# Patient Record
Sex: Male | Born: 1960 | Race: White | Hispanic: No | Smoking: Current every day smoker
Health system: Southern US, Community
[De-identification: ages and names within clinical notes are randomized; demographics above are authoritative.]

## PROBLEM LIST (undated history)

## (undated) DIAGNOSIS — I10 Essential (primary) hypertension: Secondary | ICD-10-CM

## (undated) HISTORY — PX: APPENDECTOMY: SHX54

---

## 2012-07-19 ENCOUNTER — Encounter (INDEPENDENT_AMBULATORY_CARE_PROVIDER_SITE_OTHER): Payer: Self-pay | Admitting: *Deleted

## 2014-12-29 ENCOUNTER — Encounter (INDEPENDENT_AMBULATORY_CARE_PROVIDER_SITE_OTHER): Payer: Self-pay | Admitting: *Deleted

## 2016-07-19 ENCOUNTER — Encounter (INDEPENDENT_AMBULATORY_CARE_PROVIDER_SITE_OTHER): Payer: Self-pay | Admitting: *Deleted

## 2016-08-05 ENCOUNTER — Other Ambulatory Visit (INDEPENDENT_AMBULATORY_CARE_PROVIDER_SITE_OTHER): Payer: Self-pay | Admitting: *Deleted

## 2016-08-05 ENCOUNTER — Encounter (INDEPENDENT_AMBULATORY_CARE_PROVIDER_SITE_OTHER): Payer: Self-pay | Admitting: *Deleted

## 2016-08-05 DIAGNOSIS — Z1211 Encounter for screening for malignant neoplasm of colon: Secondary | ICD-10-CM

## 2016-08-05 DIAGNOSIS — Z800 Family history of malignant neoplasm of digestive organs: Secondary | ICD-10-CM | POA: Insufficient documentation

## 2016-09-29 ENCOUNTER — Telehealth (INDEPENDENT_AMBULATORY_CARE_PROVIDER_SITE_OTHER): Payer: Self-pay | Admitting: *Deleted

## 2016-09-29 ENCOUNTER — Encounter (INDEPENDENT_AMBULATORY_CARE_PROVIDER_SITE_OTHER): Payer: Self-pay | Admitting: *Deleted

## 2016-09-29 MED ORDER — PEG 3350-KCL-NA BICARB-NACL 420 G PO SOLR: 4000.0000 mL | Freq: Once | ORAL | 0 refills | Status: AC

## 2016-09-29 NOTE — Telephone Encounter (Signed)
Patient needs trilyte 

## 2016-09-29 NOTE — Telephone Encounter (Signed)
Referring MD/PCP: fagan   Procedure: tcs  Reason/Indication:  Screening, fam hx colon ca  Has patient had this procedure before?  no  If so, when, by whom and where?    Is there a family history of colon cancer?  Yes, father  Who?  What age when diagnosed?    Is patient diabetic?   no      Does patient have prosthetic heart valve or mechanical valve?  no  Do you have a pacemaker?  no  Has patient ever had endocarditis? no  Has patient had joint replacement within last 12 months?  no  Does patient tend to be constipated or take laxatives? no  Does patient have a history of alcohol/drug use?  no  Is patient on Coumadin, Plavix and/or Aspirin? yes  Medications: asa 81 mg daily, losartan 100 mg dailt  Allergies: nkda  Medication Adjustment per Dr Laural Golden: asa 2 days  Procedure date & time: 11/03/16 at 1200

## 2016-09-29 NOTE — Telephone Encounter (Signed)
agree

## 2016-11-03 ENCOUNTER — Ambulatory Visit (HOSPITAL_COMMUNITY)
Admission: RE | Admit: 2016-11-03 | Discharge: 2016-11-03 | Disposition: A | Discharge status: Home / Self Care | Payer: Managed Care, Other (non HMO) | Source: Ambulatory Visit | Attending: Internal Medicine | Admitting: Internal Medicine

## 2016-11-03 ENCOUNTER — Encounter (HOSPITAL_COMMUNITY)
Admission: RE | Disposition: A | Discharge status: Home / Self Care | Payer: Self-pay | Source: Ambulatory Visit | Attending: Internal Medicine

## 2016-11-03 ENCOUNTER — Encounter (HOSPITAL_COMMUNITY): Payer: Self-pay | Admitting: *Deleted

## 2016-11-03 DIAGNOSIS — F17210 Nicotine dependence, cigarettes, uncomplicated: Secondary | ICD-10-CM | POA: Diagnosis not present

## 2016-11-03 DIAGNOSIS — K5730 Diverticulosis of large intestine without perforation or abscess without bleeding: Secondary | ICD-10-CM | POA: Insufficient documentation

## 2016-11-03 DIAGNOSIS — K644 Residual hemorrhoidal skin tags: Secondary | ICD-10-CM | POA: Insufficient documentation

## 2016-11-03 DIAGNOSIS — Z7982 Long term (current) use of aspirin: Secondary | ICD-10-CM | POA: Diagnosis not present

## 2016-11-03 DIAGNOSIS — Z1211 Encounter for screening for malignant neoplasm of colon: Secondary | ICD-10-CM | POA: Diagnosis not present

## 2016-11-03 DIAGNOSIS — Z800 Family history of malignant neoplasm of digestive organs: Secondary | ICD-10-CM | POA: Insufficient documentation

## 2016-11-03 DIAGNOSIS — D125 Benign neoplasm of sigmoid colon: Secondary | ICD-10-CM

## 2016-11-03 DIAGNOSIS — K6289 Other specified diseases of anus and rectum: Secondary | ICD-10-CM

## 2016-11-03 DIAGNOSIS — D123 Benign neoplasm of transverse colon: Secondary | ICD-10-CM

## 2016-11-03 DIAGNOSIS — I10 Essential (primary) hypertension: Secondary | ICD-10-CM | POA: Diagnosis not present

## 2016-11-03 HISTORY — DX: Essential (primary) hypertension: I10

## 2016-11-03 HISTORY — PX: COLONOSCOPY: SHX5424

## 2016-11-03 SURGERY — COLONOSCOPY
Anesthesia: Moderate Sedation

## 2016-11-03 MED ORDER — MEPERIDINE HCL 50 MG/ML IJ SOLN
INTRAMUSCULAR | Status: AC
  Filled 2016-11-03: qty 1

## 2016-11-03 MED ORDER — MIDAZOLAM HCL 5 MG/5ML IJ SOLN
INTRAMUSCULAR | Status: AC
  Filled 2016-11-03: qty 10

## 2016-11-03 MED ORDER — MEPERIDINE HCL 50 MG/ML IJ SOLN
INTRAMUSCULAR | Status: DC | PRN
  Administered 2016-11-03 (×2): 25 mg

## 2016-11-03 MED ORDER — SODIUM CHLORIDE 0.9 % IV SOLN
INTRAVENOUS | Status: DC
  Administered 2016-11-03: 11:00:00 via INTRAVENOUS

## 2016-11-03 MED ORDER — MIDAZOLAM HCL 5 MG/5ML IJ SOLN
INTRAMUSCULAR | Status: DC | PRN
  Administered 2016-11-03 (×2): 2 mg via INTRAVENOUS
  Administered 2016-11-03: 1 mg via INTRAVENOUS
  Administered 2016-11-03: 2 mg via INTRAVENOUS

## 2016-11-03 NOTE — Discharge Instructions (Signed)
No Aspirin or NSAIDs for 1 week. Resume other medications and diet as before. No driving for 24 hours. Physician will call with biopsy results.   Colonoscopy, Adult, Care After This sheet gives you information about how to care for yourself after your procedure. Your health care provider may also give you more specific instructions. If you have problems or questions, contact your health care provider. What can I expect after the procedure? After the procedure, it is common to have:  A small amount of blood in your stool for 24 hours after the procedure.  Some gas.  Mild abdominal cramping or bloating.  Follow these instructions at home: General instructions   For the first 24 hours after the procedure: ? Do not drive or use machinery. ? Do not sign important documents. ? Do not drink alcohol. ? Do your regular daily activities at a slower pace than normal. ? Eat soft, easy-to-digest foods. ? Rest often.  Take over-the-counter or prescription medicines only as told by your health care provider.  It is up to you to get the results of your procedure. Ask your health care provider, or the department performing the procedure, when your results will be ready. Relieving cramping and bloating  Try walking around when you have cramps or feel bloated.  Apply heat to your abdomen as told by your health care provider. Use a heat source that your health care provider recommends, such as a moist heat pack or a heating pad. ? Place a towel between your skin and the heat source. ? Leave the heat on for 20-30 minutes. ? Remove the heat if your skin turns bright red. This is especially important if you are unable to feel pain, heat, or cold. You may have a greater risk of getting burned. Eating and drinking  Drink enough fluid to keep your urine clear or pale yellow.  Resume your normal diet as instructed by your health care provider. Avoid heavy or fried foods that are hard to  digest.  Avoid drinking alcohol for as long as instructed by your health care provider. Contact a health care provider if:  You have blood in your stool 2-3 days after the procedure. Get help right away if:  You have more than a small spotting of blood in your stool.  You pass large blood clots in your stool.  Your abdomen is swollen.  You have nausea or vomiting.  You have a fever.  You have increasing abdominal pain that is not relieved with medicine. This information is not intended to replace advice given to you by your health care provider. Make sure you discuss any questions you have with your health care provider. Document Released: 12/29/2003 Document Revised: 02/08/2016 Document Reviewed: 07/28/2015 Elsevier Interactive Patient Education  [employer].

## 2016-11-03 NOTE — H&P (Addendum)
Robert Cameron is an 56 y.o. male.   Chief Complaint: Patient is here for colonoscopy. HPI: Patient is 56 year old Caucasian male who is here for screening colonoscopy. He denies abdominal pain change in bowel habits or rectal bleeding. When this procedure was scheduled patient informed us of family history is positive for CRC in brother but he is now not certain. This is patient's first exam.  Past Medical History:  Diagnosis Date  . Hypertension     Past Surgical History:  Procedure Laterality Date  . APPENDECTOMY      Family History  Problem Relation Age of Onset  . Colon cancer Brother    Social History:  reports that he has been smoking Cigarettes.  He has a 15.00 pack-year smoking history. He has quit using smokeless tobacco. He reports that he drinks about 7.2 oz of alcohol per week . He reports that he does not use drugs.  Allergies: No Known Allergies  Medications Prior to Admission  Medication Sig Dispense Refill  . aspirin EC 81 MG tablet Take 81 mg by mouth daily.    Marland Kitchen losartan (COZAAR) 100 MG tablet Take 100 mg by mouth daily.  12    No results found for this or any previous visit (from the past 48 hour(s)). No results found.  ROS  Blood pressure (!) 136/100, pulse 84, temperature 98.3 F (36.8 C), temperature source Oral, resp. rate 15, height 5\' 8"  (1.727 m), weight 215 lb (97.5 kg), SpO2 97 %. Physical Exam  Constitutional: He appears well-developed and well-nourished.  HENT:  Mouth/Throat: Oropharynx is clear and moist.  Eyes: Conjunctivae are normal. No scleral icterus.  Neck: No thyromegaly present.  Cardiovascular: Normal rate, regular rhythm and normal heart sounds.   No murmur heard. Respiratory: Effort normal and breath sounds normal.  GI: Soft. He exhibits no distension and no mass. There is no tenderness.  Musculoskeletal: He exhibits no edema.  Lymphadenopathy:    He has no cervical adenopathy.  Neurological: He is alert.  Skin: Skin is warm  and dry.     Assessment/Plan Average risk screening colonoscopy.  Hildred Laser, MD 11/03/2016, 11:13 AM

## 2016-11-03 NOTE — Op Note (Signed)
[facility] Patient Name: [facility] Procedure Date: 11/03/2016 11:00 AM MRN: [id number] Date of Birth: 12/23/1960 Attending MD: Hildred Laser , MD CSN: [id number] Age: 56 Admit Type: Outpatient Procedure:                Colonoscopy Indications:              Screening for colorectal malignant neoplasm Providers:                Hildred Laser, MD, Janeece Riggers, RN, Rosina Lowenstein, RN Referring MD:             Asencion Noble, MD Medicines:                Meperidine 50 mg IV, Midazolam 7 mg IV Complications:            No immediate complications. Estimated Blood Loss:     Estimated blood loss was minimal. Procedure:                Pre-Anesthesia Assessment:                           - Prior to the procedure, a History and Physical                            was performed, and patient medications and                            allergies were reviewed. The patient's tolerance of                            previous anesthesia was also reviewed. The risks                            and benefits of the procedure and the sedation                            options and risks were discussed with the patient.                            All questions were answered, and informed consent                            was obtained. Prior Anticoagulants: The patient                            last took aspirin 3 days prior to the procedure.                            ASA Grade Assessment: I - A normal, healthy                            patient. After reviewing the risks and benefits,                            the patient was deemed in satisfactory condition to  undergo the procedure.                           After obtaining informed consent, the colonoscope                            was passed under direct vision. Throughout the                            procedure, the patient's blood pressure, pulse, and                            oxygen saturations were monitored  continuously. The                            EC-[id number] ([id number]) scope was introduced through                            the anus and advanced to the the cecum, identified                            by appendiceal orifice and ileocecal valve. The                            colonoscopy was performed without difficulty. The                            patient tolerated the procedure well. The quality                            of the bowel preparation was good. The ileocecal                            valve, appendiceal orifice, and rectum were                            photographed. Scope In: 11:22:36 AM Scope Out: 11:49:26 AM Scope Withdrawal Time: 0 hours 23 minutes 55 seconds  Total Procedure Duration: 0 hours 26 minutes 50 seconds  Findings:      The perianal and digital rectal examinations were normal.      Three sessile polyps were found in the proximal sigmoid colon, splenic       flexure and hepatic flexure. The polyps were small in size. These polyps       were removed with a cold snare. Resection and retrieval were complete.       The pathology specimen was placed into Bottle Number 1.      Three pedunculated polyps were found in the mid sigmoid colon and distal       sigmoid colon. The polyps were 6 to 10 mm in size. These polyps were       removed with a hot snare. Resection and retrieval were complete. The       pathology specimen was placed into Bottle Number 2.      A single small-mouthed diverticulum was found in the sigmoid colon.      External  hemorrhoids were found during retroflexion. The hemorrhoids       were small.      Anal papilla(e) were hypertrophied. Impression:               - Three small polyps in the proximal sigmoid colon,                            at the splenic flexure and at the hepatic flexure,                            removed with a cold snare. Resected and retrieved.                           - Three 6 to 10 mm polyps in the mid sigmoid colon                             and in the distal sigmoid colon, removed with a hot                            snare. Resected and retrieved.                           - Diverticulosis in the sigmoid colon.                           - External hemorrhoids.                           - Anal papilla(e) were hypertrophied. Moderate Sedation:      Moderate (conscious) sedation was administered by the endoscopy nurse       and supervised by the endoscopist. The following parameters were       monitored: oxygen saturation, heart rate, blood pressure, CO2       capnography and response to care. Total physician intraservice time was       33 minutes. Recommendation:           - Patient has a contact number available for                            emergencies. The signs and symptoms of potential                            delayed complications were discussed with the                            patient. Return to normal activities tomorrow.                            Written discharge instructions were provided to the                            patient.                           - Resume previous diet today.                           -  Continue present medications.                           - Await pathology results.                           - Repeat colonoscopy for surveillance based on                            pathology results. Procedure Code(s):        --- Professional ---                           [phone], Colonoscopy, flexible; with removal of                            tumor(s), polyp(s), or other lesion(s) by snare                            technique                           99152, Moderate sedation services provided by the                            same physician or other qualified health care                            professional performing the diagnostic or                            therapeutic service that the sedation supports,                            requiring the presence of an  independent trained                            observer to assist in the monitoring of the                            patient's level of consciousness and physiological                            status; initial 15 minutes of intraservice time,                            patient age 4 years or older                           [phone], Moderate sedation services; each additional                            15 minutes intraservice time Diagnosis Code(s):        --- Professional ---  Z12.11, Encounter for screening for malignant                            neoplasm of colon                           D12.3, Benign neoplasm of transverse colon (hepatic                            flexure or splenic flexure)                           D12.5, Benign neoplasm of sigmoid colon                           K64.4, Residual hemorrhoidal skin tags                           K62.89, Other specified diseases of anus and rectum                           K57.30, Diverticulosis of large intestine without                            perforation or abscess without bleeding CPT copyright 2016 American Medical Association. All rights reserved. The codes documented in this report are preliminary and upon coder review may  be revised to meet current compliance requirements. Hildred Laser, MD Hildred Laser, MD 11/03/2016 12:00:42 PM This report has been signed electronically. Number of Addenda: 0

## 2016-11-09 ENCOUNTER — Encounter (HOSPITAL_COMMUNITY): Payer: Self-pay | Admitting: Internal Medicine

## 2018-02-19 ENCOUNTER — Encounter: Payer: Self-pay | Admitting: Neurology

## 2018-02-19 ENCOUNTER — Ambulatory Visit: Payer: 59 | Admitting: Neurology

## 2018-02-19 ENCOUNTER — Other Ambulatory Visit: Payer: Self-pay

## 2018-02-19 VITALS — BP 151/85 | HR 104 | Ht 68.0 in | Wt 220.0 lb

## 2018-02-19 DIAGNOSIS — R202 Paresthesia of skin: Secondary | ICD-10-CM | POA: Diagnosis not present

## 2018-02-19 NOTE — Progress Notes (Signed)
Reason for visit: Right foot numbness  Referring physician: Dr. Royal Piedra is a 57 y.o. male  History of present illness:  Robert Cameron is a 57 year old right-handed white male with a history of hypertension who presents with a 1 year history of some numbness in the distal portion of the right foot.  The patient has no real pain involved with this, but he does indicate that several years ago he hurt his left back with lifting something heavy.  He has had occasional events of discomfort going down the posterior aspect of the right leg from the thigh level down to below the knee.  This is not a persistent problem for him.  The patient has no weakness in the lower extremities, he reports no numbness in the left foot.  He has no troubles with balance or difficulty controlling the bowels or the bladder.  He has occasional neck pain without pain down the arms, he reports no numbness or weakness of the hands or the arms.  He has developed a sensation of swelling in the toes of the right foot recently, he comes in today for an evaluation.   Past Medical History:  Diagnosis Date  . Hypertension     Past Surgical History:  Procedure Laterality Date  . APPENDECTOMY    . COLONOSCOPY N/A 11/03/2016   Procedure: COLONOSCOPY;  Surgeon: Rogene Houston, MD;  Location: [facility];  Service: Endoscopy;  Laterality: N/A;  1200    Family History  Problem Relation Age of Onset  . Colon cancer Brother   . Cancer - Lung Brother   . Dementia Mother   . Cancer Father        bladder    Social history:  reports that he has been smoking cigarettes. He has a 15.00 pack-year smoking history. He has quit using smokeless tobacco. He reports that he drinks about 12.0 standard drinks of alcohol per week. He reports that he does not use drugs.  Medications:  Prior to Admission medications   Medication Sig Start Date End Date Taking? Authorizing Provider  aspirin EC 81 MG tablet Take 1 tablet (81 mg  total) by mouth daily. 11/10/16  Yes Rehman, Mechele Dawley, MD  losartan (COZAAR) 100 MG tablet Take 100 mg by mouth daily. 10/19/16  Yes [provider]     No Known Allergies  ROS:  Out of a complete 14 system review of symptoms, the patient complains only of the following symptoms, and all other reviewed systems are negative.  Numbness  Blood pressure (!) 151/85, pulse (!) 104, height 5\' 8"  (1.727 m), weight 220 lb (99.8 kg).  Physical Exam  General: The patient is alert and cooperative at the time of the examination.  The patient is moderately obese.  Eyes: Pupils are equal, round, and reactive to light. Discs are flat bilaterally.  Neck: The neck is supple, no carotid bruits are noted.  Respiratory: The respiratory examination is clear.  Cardiovascular: The cardiovascular examination reveals a regular rate and rhythm, no obvious murmurs or rubs are noted.  Skin: Extremities are without significant edema.  Neurologic Exam  Mental status: The patient is alert and oriented x 3 at the time of the examination. The patient has apparent normal recent and remote memory, with an apparently normal attention span and concentration ability.  Cranial nerves: Facial symmetry is present. There is good sensation of the face to pinprick and soft touch bilaterally. The strength of the facial muscles  and the muscles to head turning and shoulder shrug are normal bilaterally. Speech is well enunciated, no aphasia or dysarthria is noted. Extraocular movements are full. Visual fields are full. The tongue is midline, and the patient has symmetric elevation of the soft palate. No obvious hearing deficits are noted.  Motor: The motor testing reveals 5 over 5 strength of all 4 extremities. Good symmetric motor tone is noted throughout.  Sensory: Sensory testing is intact to pinprick, soft touch, vibration sensation, and position sense on all 4 extremities.  No evidence of a stocking pattern pinprick  sensory deficit was noted.  No evidence of extinction is noted.  Coordination: Cerebellar testing reveals good finger-nose-finger and heel-to-shin bilaterally.  Gait and station: Gait is normal. Tandem gait is normal. Romberg is negative. No drift is seen.  The patient is able to walk on the heels and the toes bilaterally.  Reflexes: Deep tendon reflexes are symmetric and normal bilaterally. Toes are downgoing bilaterally.   Assessment/Plan:  1.  Numbness, right foot  The etiology of the sensory alteration in the distal right foot is not clear.  The patient will be set up for nerve conduction studies on both legs, EMG on the right leg.  He will follow-up for the above study.  Jill Alexanders MD 02/19/2018 3:25 PM  [facility] [address] [place], [place] [address]  Phone [phone] Fax [phone]

## 2018-03-20 ENCOUNTER — Ambulatory Visit: Payer: 59 | Admitting: Neurology

## 2018-03-20 ENCOUNTER — Ambulatory Visit (INDEPENDENT_AMBULATORY_CARE_PROVIDER_SITE_OTHER): Payer: 59 | Admitting: Neurology

## 2018-03-20 ENCOUNTER — Encounter: Payer: Self-pay | Admitting: Neurology

## 2018-03-20 DIAGNOSIS — R202 Paresthesia of skin: Secondary | ICD-10-CM

## 2018-03-20 NOTE — Progress Notes (Signed)
Please refer to EMG and nerve conduction procedure note.  

## 2018-03-20 NOTE — Procedures (Signed)
     HISTORY:  Robert Cameron is a 57 year old gentleman with a history of left-sided back pain, he also reports some occasional discomfort in the thigh on the right and some right knee pain and some numbness that has been persistent in the right foot.  The patient is being evaluated for a possible neuropathy or a radiculopathy.  NERVE CONDUCTION STUDIES:  Nerve conduction studies were performed on both lower extremities. The distal motor latencies and motor amplitudes for the peroneal and posterior tibial nerves were within normal limits, with exception of a low amplitude for the right posterior tibial nerve. The nerve conduction velocities for these nerves were also normal. The sensory latencies for the peroneal and sural nerves were within normal limits. The F wave latencies for the posterior tibial nerves were within normal limits.   EMG STUDIES:  EMG study was performed on the right lower extremity:  The tibialis anterior muscle reveals 2 to 4K motor units with full recruitment. No fibrillations or positive waves were seen. The peroneus tertius muscle reveals 2 to 4K motor units with full recruitment. No fibrillations or positive waves were seen. The medial gastrocnemius muscle reveals 1 to 3K motor units with full recruitment. No fibrillations or positive waves were seen. The vastus lateralis muscle reveals 2 to 4K motor units with full recruitment. No fibrillations or positive waves were seen. The iliopsoas muscle reveals 2 to 4K motor units with full recruitment. No fibrillations or positive waves were seen. The biceps femoris muscle (long head) reveals 2 to 4K motor units with full recruitment. No fibrillations or positive waves were seen. The lumbosacral paraspinal muscles were tested at 3 levels, and revealed no abnormalities of insertional activity at all 3 levels tested. There was good relaxation.   IMPRESSION:  Nerve conduction studies done on both lower extremities were  relatively unremarkable with exception of a slightly low motor amplitude for the posterior tibial nerve on the right.  EMG evaluation of the right lower extremity was unremarkable, no evidence of a lumbosacral radiculopathy was seen.  Jill Alexanders MD 03/20/2018 3:50 PM  [facility] [address] [place],  [address]  Phone [phone] Fax [phone]

## 2018-03-20 NOTE — Progress Notes (Addendum)
The patient comes in office today for evaluation of his left-sided back pain and right foot numbness and sensation of swelling.  He does report some pain in the thigh at times on the right and some right knee pain.  Nerve conduction studies showed no evidence of neuropathy, EMG on the right leg is unremarkable.  The patient wishes to pursue further evaluation, we will check MRI of the lumbar spine.       [place]    Nerve / Sites Muscle Latency Ref. Amplitude Ref. Rel Amp Segments Distance Velocity Ref. Area    ms ms mV mV %  cm m/s m/s mVms  R Peroneal - EDB     Ankle EDB 3.9 ?6.5 6.9 ?2.0 100 Ankle - EDB 9   16.7     Fib head EDB 9.7  6.3  92.6 Fib head - Ankle 31 53 ?44 17.5     Pop fossa EDB 11.9  6.2  98.5 Pop fossa - Fib head 10 45 ?44 19.8         Pop fossa - Ankle      L Peroneal - EDB     Ankle EDB 4.3 ?6.5 5.2 ?2.0 100 Ankle - EDB 9   14.7     Fib head EDB 10.5  4.6  88.8 Fib head - Ankle 31 50 ?44 14.2     Pop fossa EDB 12.3  4.5  99 Pop fossa - Fib head 10 55 ?44 14.3         Pop fossa - Ankle      R Tibial - AH     Ankle AH 4.0 ?5.8 2.0 ?4.0 100 Ankle - AH 9   3.9     Pop fossa AH 12.8  1.2  59 Pop fossa - Ankle 37 42 ?41 2.1  L Tibial - AH     Ankle AH 5.1 ?5.8 4.8 ?4.0 100 Ankle - AH 9   9.9     Pop fossa AH 12.7  4.0  83.7 Pop fossa - Ankle 37 48 ?41 10.5             SNC    Nerve / Sites Rec. Site Peak Lat Ref.  Amp Ref. Segments Distance    ms ms V V  cm  R Sural - Ankle (Calf)     Calf Ankle 3.6 ?4.4 15 ?6 Calf - Ankle 14  L Sural - Ankle (Calf)     Calf Ankle 3.3 ?4.4 7 ?6 Calf - Ankle 14  R Superficial peroneal - Ankle     Lat leg Ankle 3.7 ?4.4 8 ?6 Lat leg - Ankle 14  L Superficial peroneal - Ankle     Lat leg Ankle 3.7 ?4.4 7 ?6 Lat leg - Ankle 14               F  Wave    Nerve F Lat Ref.   ms ms  R Tibial - AH 47.9 ?56.0  L Tibial - AH 47.2 ?56.0         EMG full

## 2018-03-21 ENCOUNTER — Telehealth: Payer: Self-pay | Admitting: Neurology

## 2018-03-21 NOTE — Telephone Encounter (Signed)
lvm for pt to be aware of this. Also left their number of [phone] to call if he has not head in the next 2-3 business days.

## 2018-03-21 NOTE — Telephone Encounter (Signed)
[employer] order sent to [facility]. They will obtain the auth and reach out to the pt to schedule.  °

## 2018-03-28 NOTE — Telephone Encounter (Signed)
I called for the peer to Peer review. The MRI of the lumbar spine has been approved.

## 2018-03-28 NOTE — Telephone Encounter (Signed)
Robert Cameron with [facility] informed me that [employer] did not approve the MRI Lumbar spine.  "Based on [employer] spine imaging guidelines, we cannot approve this request. MRI might be supported in the evaluation of suspected or known spinal disease with one of the following. 1. Failure to improve after a recent  (within 3 months) 6 week trial of provider directed treatment with clinical re-evaluation or 2. Any signs of symptoms such as significant motor weakness, malignancy, infection, cauda equina syndrome, for which conservative treatment is not needed.  There is an option for a peer to peer the phone number is [phone] and the case number is [id number]. Right now he is scheduled at [facility] for 04/02/18.

## 2018-03-29 NOTE — Telephone Encounter (Signed)
Noted, thank you

## 2018-04-02 ENCOUNTER — Other Ambulatory Visit: Payer: Self-pay | Admitting: Neurology

## 2018-04-02 ENCOUNTER — Ambulatory Visit
Admission: RE | Admit: 2018-04-02 | Discharge: 2018-04-02 | Disposition: A | Discharge status: Home / Self Care | Payer: 59 | Source: Ambulatory Visit | Attending: Neurology | Admitting: Neurology

## 2018-04-02 DIAGNOSIS — R202 Paresthesia of skin: Secondary | ICD-10-CM

## 2018-04-02 DIAGNOSIS — S0550XA Penetrating wound with foreign body of unspecified eyeball, initial encounter: Secondary | ICD-10-CM

## 2018-04-04 ENCOUNTER — Telehealth: Payer: Self-pay | Admitting: Neurology

## 2018-04-04 NOTE — Telephone Encounter (Signed)
  I called the patient.  MRI of the lumbar spine is unremarkable, no surgically amenable problems.  If the patient has significant discomfort, he is to contact our office and we will start medications for the pain.  MRI lumbar 04/03/18:  IMPRESSION:   MRI lumbar spine (without) demonstrating: - Mild disc bulging noted from L2-3 to L5-S1. No spinal stenosis or foraminal narrowing.

## 2019-11-20 ENCOUNTER — Encounter (INDEPENDENT_AMBULATORY_CARE_PROVIDER_SITE_OTHER): Payer: Self-pay | Admitting: *Deleted

## 2020-06-12 IMAGING — CR DG ORBITS FOR FOREIGN BODY
2 series · 2 of 2 positions shown · non-contrast
Comparison: None.

CLINICAL DATA: Metal worker.  Pre MRI screening.

EXAM:
ORBITS FOR FOREIGN BODY - 2 VIEW

[w orbit pa (1 of 2)]
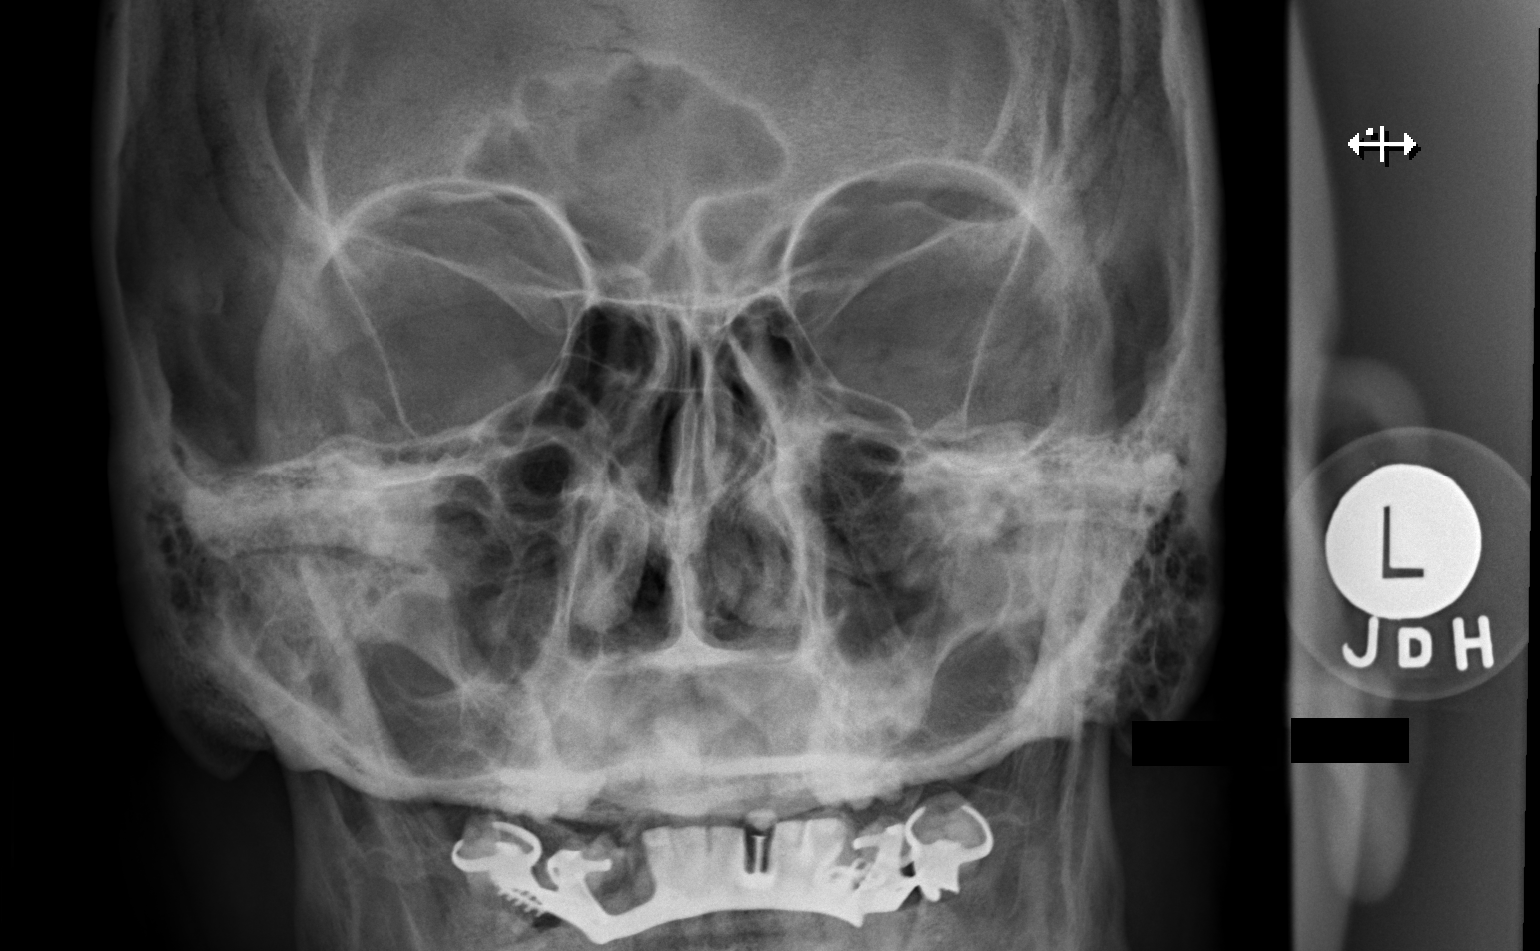

[w orbit pa (2 of 2)]
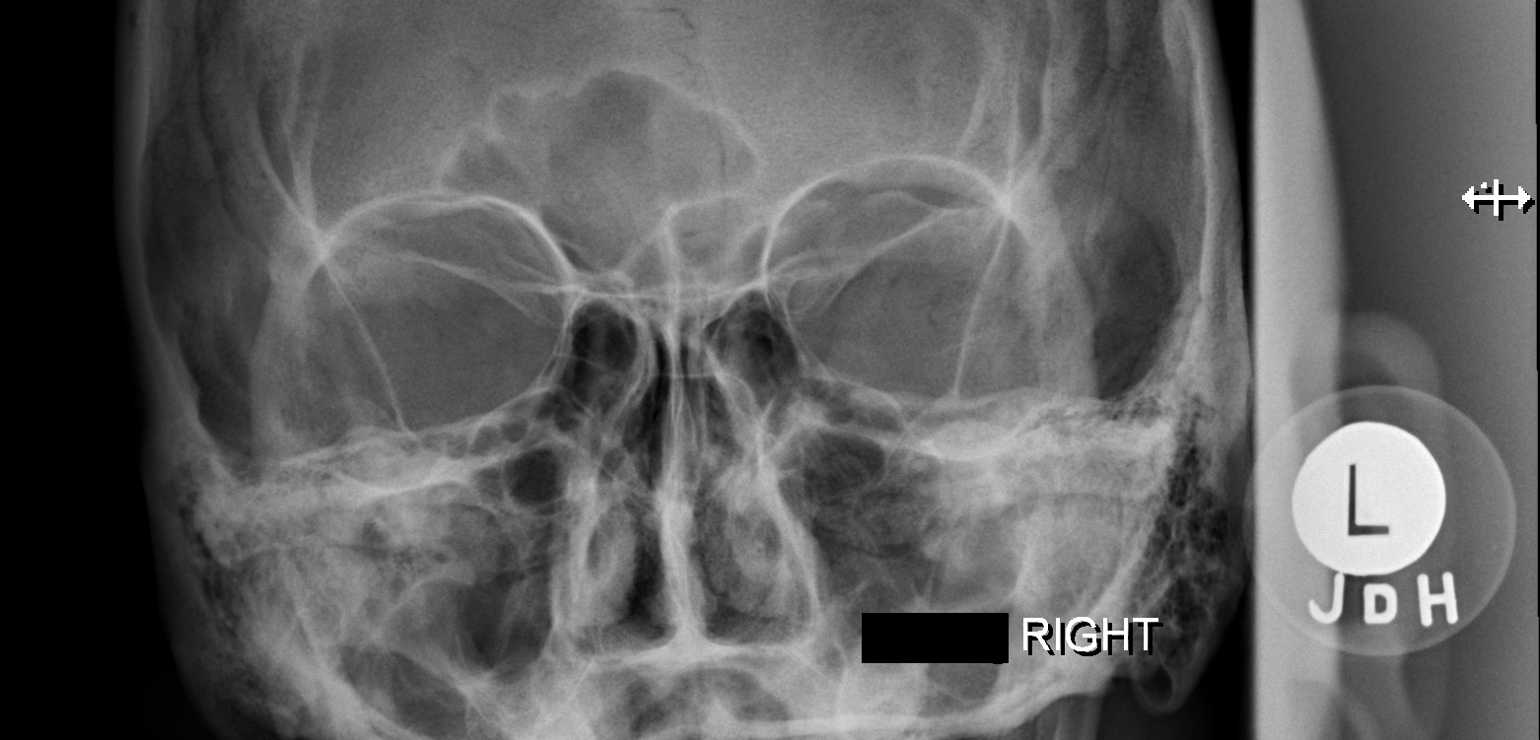

[2 of 2 positions shown; findings below may reference images not displayed]

FINDINGS: There is no evidence of metallic foreign body within the orbits. No
significant bone abnormality identified.
IMPRESSION: No evidence of metallic foreign body within the orbits.

## 2022-04-26 ENCOUNTER — Encounter (INDEPENDENT_AMBULATORY_CARE_PROVIDER_SITE_OTHER): Payer: Self-pay | Admitting: *Deleted

## 2022-05-25 ENCOUNTER — Telehealth: Payer: Self-pay | Admitting: *Deleted

## 2022-05-25 NOTE — Telephone Encounter (Signed)
Any room Thanks 

## 2022-05-25 NOTE — Telephone Encounter (Signed)
Referring MD/PCP: Asencion Noble  Procedure: Colonoscopy  Has patient had this procedure before?  2018  If so, when, by whom and where?    Is there a family history of colon cancer?  no  Who?  What age when diagnosed?    Is patient diabetic? If yes, Type 1 or Type 2   no      Does patient have prosthetic heart valve or mechanical valve?  no  Do you have a pacemaker/defibrillator?  no  Has patient ever had endocarditis/atrial fibrillation? no  Does patient use oxygen? no  Has patient had joint replacement within last 12 months?  no  Is patient constipated or do they take laxatives? no  Does patient have a history of alcohol/drug use?  no  Have you had a stroke/heart attack last 6 mths? no  Do you take medicine for weight loss?  no  Is patient on blood thinner such as Coumadin, Plavix and/or Aspirin? no  Medications:  Current Outpatient Medications on File Prior to Visit  Medication Sig Dispense Refill   diltiazem (CARDIZEM CD) 180 MG 24 hr capsule Take 180 mg by mouth daily.     losartan (COZAAR) 100 MG tablet Take 100 mg by mouth daily.  12   No current facility-administered medications on file prior to visit.     Allergies: No Known Allergies   [facility]

## 2022-05-26 ENCOUNTER — Encounter: Payer: Self-pay | Admitting: *Deleted

## 2022-05-26 ENCOUNTER — Encounter (INDEPENDENT_AMBULATORY_CARE_PROVIDER_SITE_OTHER): Payer: Self-pay | Admitting: *Deleted

## 2022-05-26 MED ORDER — PEG 3350-KCL-NA BICARB-NACL 420 G PO SOLR: 4000.0000 mL | Freq: Once | ORAL | 0 refills | Status: AC

## 2022-05-26 NOTE — Telephone Encounter (Signed)
Pt has been scheduled for 06/23/22 at 8:15 am. Instructions mailed and prep sent to the pharmacy.

## 2022-05-26 NOTE — Telephone Encounter (Signed)
LMOVM to call back 

## 2022-06-17 NOTE — Patient Instructions (Signed)
Robert Cameron  06/17/2022     '@PREFPERIOPPHARMACY'$ @   Your procedure is scheduled on  06/23/2022.   Report to [name] at  [name]   Call this number if you have problems the morning of surgery:  [phone]  If you experience any cold or flu symptoms such as cough, fever, chills, shortness of breath, etc. between now and your scheduled surgery, please notify us at the above number.   Remember:  Follow the diet and prep instructions given to you by the office.     Take these medicines the morning of surgery with A SIP OF WATER                                      Diltiazem.     Do not wear jewelry, make-up or nail polish.  Do not wear lotions, powders, or perfumes, or deodorant.  Do not shave 48 hours prior to surgery.  Men may shave face and neck.  Do not bring valuables to the hospital.  [facility] is not responsible for any belongings or valuables.  Contacts, dentures or bridgework may not be worn into surgery.  Leave your suitcase in the car.  After surgery it may be brought to your room.  For patients admitted to the hospital, discharge time will be determined by your treatment team.  Patients discharged the day of surgery will not be allowed to drive home and must  have someone with them for 24 hours.    Special instructions:   DO NOT smoke tobacco or vape for 24 hours before your procedure.  Please read over the following fact sheets that you were given. Anesthesia Post-op Instructions and Care and Recovery After Surgery      Colonoscopy, Adult, Care After The following information offers guidance on how to care for yourself after your procedure. Your health care provider may also give you more specific instructions. If you have problems or questions, contact your health care provider. What can I expect after the procedure? After the procedure, it is common to have: A small amount of blood in your stool for 24 hours after the procedure. Some  gas. Mild cramping or bloating of your abdomen. Follow these instructions at home: Eating and drinking  Drink enough fluid to keep your urine pale yellow. Follow instructions from your health care provider about eating or drinking restrictions. Resume your normal diet as told by your health care provider. Avoid heavy or fried foods that are hard to digest. Activity Rest as told by your health care provider. Avoid sitting for a long time without moving. Get up to take short walks every 1-2 hours. This is important to improve blood flow and breathing. Ask for help if you feel weak or unsteady. Return to your normal activities as told by your health care provider. Ask your health care provider what activities are safe for you. Managing cramping and bloating  Try walking around when you have cramps or feel bloated. If directed, apply heat to your abdomen as told by your health care provider. Use the heat source that your health care provider recommends, such as a moist heat pack or a heating pad. Place a towel between your skin and the heat source. Leave the heat on for 20-30 minutes. Remove the heat if your skin turns bright red. This is especially important if you are  unable to feel pain, heat, or cold. You have a greater risk of getting burned. General instructions If you were given a sedative during the procedure, it can affect you for several hours. Do not drive or operate machinery until your health care provider says that it is safe. For the first 24 hours after the procedure: Do not sign important documents. Do not drink alcohol. Do your regular daily activities at a slower pace than normal. Eat soft foods that are easy to digest. Take over-the-counter and prescription medicines only as told by your health care provider. Keep all follow-up visits. This is important. Contact a health care provider if: You have blood in your stool 2-3 days after the procedure. Get help right away  if: You have more than a small spotting of blood in your stool. You have large blood clots in your stool. You have swelling of your abdomen. You have nausea or vomiting. You have a fever. You have increasing pain in your abdomen that is not relieved with medicine. These symptoms may be an emergency. Get help right away. Call 911. Do not wait to see if the symptoms will go away. Do not drive yourself to the hospital. Summary After the procedure, it is common to have a small amount of blood in your stool. You may also have mild cramping and bloating of your abdomen. If you were given a sedative during the procedure, it can affect you for several hours. Do not drive or operate machinery until your health care provider says that it is safe. Get help right away if you have a lot of blood in your stool, nausea or vomiting, a fever, or increased pain in your abdomen. This information is not intended to replace advice given to you by your health care provider. Make sure you discuss any questions you have with your health care provider. Document Revised: 01/06/2021 Document Reviewed: 01/06/2021 Elsevier Patient Education  [place] After The following information offers guidance on how to care for yourself after your procedure. Your health care provider may also give you more specific instructions. If you have problems or questions, contact your health care provider. What can I expect after the procedure? After the procedure, it is common to have: Tiredness. Little or no memory about what happened during or after the procedure. Impaired judgment when it comes to making decisions. Nausea or vomiting. Some trouble with balance. Follow these instructions at home: For the time period you were told by your health care provider:  Rest. Do not participate in activities where you could fall or become injured. Do not drive or use machinery. Do not drink  alcohol. Do not take sleeping pills or medicines that cause drowsiness. Do not make important decisions or sign legal documents. Do not take care of children on your own. Medicines Take over-the-counter and prescription medicines only as told by your health care provider. If you were prescribed antibiotics, take them as told by your health care provider. Do not stop using the antibiotic even if you start to feel better. Eating and drinking Follow instructions from your health care provider about what you may eat and drink. Drink enough fluid to keep your urine pale yellow. If you vomit: Drink clear fluids slowly and in small amounts as you are able. Clear fluids include water, ice chips, low-calorie sports drinks, and fruit juice that has water added to it (diluted fruit juice). Eat light and bland foods in small amounts as you  are able. These foods include bananas, applesauce, rice, lean meats, toast, and crackers. General instructions  Have a responsible adult stay with you for the time you are told. It is important to have someone help care for you until you are awake and alert. If you have sleep apnea, surgery and some medicines can increase your risk for breathing problems. Follow instructions from your health care provider about wearing your sleep device: When you are sleeping. This includes during daytime naps. While taking prescription pain medicines, sleeping medicines, or medicines that make you drowsy. Do not use any products that contain nicotine or tobacco. These products include cigarettes, chewing tobacco, and vaping devices, such as e-cigarettes. If you need help quitting, ask your health care provider. Contact a health care provider if: You feel nauseous or vomit every time you eat or drink. You feel light-headed. You are still sleepy or having trouble with balance after 24 hours. You get a rash. You have a fever. You have redness or swelling around the IV site. Get help  right away if: You have trouble breathing. You have new confusion after you get home. These symptoms may be an emergency. Get help right away. Call 911. Do not wait to see if the symptoms will go away. Do not drive yourself to the hospital. This information is not intended to replace advice given to you by your health care provider. Make sure you discuss any questions you have with your health care provider. Document Revised: 10/11/2021 Document Reviewed: 10/11/2021 Elsevier Patient Education  [place].

## 2022-06-20 ENCOUNTER — Encounter (HOSPITAL_COMMUNITY)
Admission: RE | Admit: 2022-06-20 | Discharge: 2022-06-20 | Disposition: A | Discharge status: Home / Self Care | Payer: 59 | Source: Ambulatory Visit | Attending: Gastroenterology | Admitting: Gastroenterology

## 2022-06-20 ENCOUNTER — Other Ambulatory Visit (HOSPITAL_COMMUNITY): Payer: 59

## 2022-06-20 ENCOUNTER — Encounter (HOSPITAL_COMMUNITY): Payer: Self-pay

## 2022-06-20 VITALS — BP 141/85 | HR 75 | Temp 97.7°F | Resp 18 | Ht 68.0 in | Wt 220.0 lb

## 2022-06-20 DIAGNOSIS — Z01810 Encounter for preprocedural cardiovascular examination: Secondary | ICD-10-CM | POA: Insufficient documentation

## 2022-06-20 DIAGNOSIS — R9431 Abnormal electrocardiogram [ECG] [EKG]: Secondary | ICD-10-CM | POA: Diagnosis not present

## 2022-06-20 DIAGNOSIS — I10 Essential (primary) hypertension: Secondary | ICD-10-CM | POA: Diagnosis not present

## 2022-06-23 ENCOUNTER — Ambulatory Visit (HOSPITAL_COMMUNITY): Payer: 59 | Admitting: Anesthesiology

## 2022-06-23 ENCOUNTER — Encounter (INDEPENDENT_AMBULATORY_CARE_PROVIDER_SITE_OTHER): Payer: Self-pay | Admitting: *Deleted

## 2022-06-23 ENCOUNTER — Ambulatory Visit (HOSPITAL_BASED_OUTPATIENT_CLINIC_OR_DEPARTMENT_OTHER): Payer: 59 | Admitting: Anesthesiology

## 2022-06-23 ENCOUNTER — Encounter (HOSPITAL_COMMUNITY)
Admission: RE | Disposition: A | Discharge status: Home / Self Care | Payer: Self-pay | Source: Home / Self Care | Attending: Gastroenterology

## 2022-06-23 ENCOUNTER — Encounter (HOSPITAL_COMMUNITY): Payer: Self-pay | Admitting: Gastroenterology

## 2022-06-23 ENCOUNTER — Ambulatory Visit (HOSPITAL_COMMUNITY)
Admission: RE | Admit: 2022-06-23 | Discharge: 2022-06-23 | Disposition: A | Discharge status: Home / Self Care | Payer: 59 | Attending: Gastroenterology | Admitting: Gastroenterology

## 2022-06-23 DIAGNOSIS — K5730 Diverticulosis of large intestine without perforation or abscess without bleeding: Secondary | ICD-10-CM

## 2022-06-23 DIAGNOSIS — D122 Benign neoplasm of ascending colon: Secondary | ICD-10-CM | POA: Insufficient documentation

## 2022-06-23 DIAGNOSIS — Z86010 Personal history of colonic polyps: Secondary | ICD-10-CM | POA: Insufficient documentation

## 2022-06-23 DIAGNOSIS — K648 Other hemorrhoids: Secondary | ICD-10-CM | POA: Diagnosis not present

## 2022-06-23 DIAGNOSIS — Z1211 Encounter for screening for malignant neoplasm of colon: Secondary | ICD-10-CM | POA: Diagnosis present

## 2022-06-23 DIAGNOSIS — K635 Polyp of colon: Secondary | ICD-10-CM

## 2022-06-23 DIAGNOSIS — F17210 Nicotine dependence, cigarettes, uncomplicated: Secondary | ICD-10-CM | POA: Diagnosis not present

## 2022-06-23 DIAGNOSIS — I10 Essential (primary) hypertension: Secondary | ICD-10-CM | POA: Insufficient documentation

## 2022-06-23 DIAGNOSIS — D123 Benign neoplasm of transverse colon: Secondary | ICD-10-CM | POA: Diagnosis not present

## 2022-06-23 HISTORY — PX: POLYPECTOMY: SHX5525

## 2022-06-23 HISTORY — PX: COLONOSCOPY WITH PROPOFOL: SHX5780

## 2022-06-23 LAB — HM COLONOSCOPY

## 2022-06-23 SURGERY — COLONOSCOPY WITH PROPOFOL
Anesthesia: General

## 2022-06-23 MED ORDER — LACTATED RINGERS IV SOLN: INTRAVENOUS | Status: DC

## 2022-06-23 MED ORDER — LIDOCAINE HCL 1 % IJ SOLN
INTRAMUSCULAR | Status: DC | PRN
  Administered 2022-06-23: 50 mg via INTRADERMAL

## 2022-06-23 MED ORDER — PROPOFOL 500 MG/50ML IV EMUL
INTRAVENOUS | Status: DC | PRN
  Administered 2022-06-23: 150 ug/kg/min via INTRAVENOUS

## 2022-06-23 MED ORDER — PROPOFOL 10 MG/ML IV BOLUS
INTRAVENOUS | Status: DC | PRN
  Administered 2022-06-23 (×2): 100 mg via INTRAVENOUS

## 2022-06-23 NOTE — Transfer of Care (Signed)
Immediate Anesthesia Transfer of Care Note  Patient: Robert Cameron  Procedure(s) Performed: COLONOSCOPY WITH PROPOFOL POLYPECTOMY  Patient Location: Short Stay  Anesthesia Type:General  Level of Consciousness: awake  Airway & Oxygen Therapy: Patient Spontanous Breathing  Post-op Assessment: Report given to RN  Post vital signs: Reviewed  Last Vitals:  Vitals Value Taken Time  BP 97/56 06/23/22 0901  Temp 36.7 C 06/23/22 0901  Pulse 74 06/23/22 0901  Resp 18 06/23/22 0901  SpO2 97 % 06/23/22 0901    Last Pain:  Vitals:   06/23/22 0901  TempSrc: Oral  PainSc: 0-No pain      Patients Stated Pain Goal: 5 (12/09/50 4799)  Complications: No notable events documented.

## 2022-06-23 NOTE — Anesthesia Postprocedure Evaluation (Signed)
Anesthesia Post Note  Patient: Robert Cameron  Procedure(s) Performed: COLONOSCOPY WITH PROPOFOL POLYPECTOMY  Patient location during evaluation: Short Stay Anesthesia Type: General Level of consciousness: awake and alert Pain management: pain level controlled Vital Signs Assessment: post-procedure vital signs reviewed and stable Respiratory status: spontaneous breathing Cardiovascular status: blood pressure returned to baseline and stable Postop Assessment: no apparent nausea or vomiting Anesthetic complications: no   No notable events documented.   Last Vitals:  Vitals:   06/23/22 0708 06/23/22 0901  BP: 137/89 (!) 97/56  Pulse: 75 74  Resp: 16 18  Temp: 36.8 C 36.7 C  SpO2: 97% 97%    Last Pain:  Vitals:   06/23/22 0901  TempSrc: Oral  PainSc: 0-No pain                 [name]

## 2022-06-23 NOTE — Discharge Instructions (Signed)
You are being discharged to home.  Resume your previous diet.  We are waiting for your pathology results.  Your physician has recommended a repeat colonoscopy for surveillance based on pathology results.  

## 2022-06-23 NOTE — Anesthesia Preprocedure Evaluation (Signed)
Anesthesia Evaluation  Patient identified by MRN, date of birth, ID band Patient awake    Reviewed: Allergy & Precautions, H&P , NPO status , Patient's Chart, lab work & pertinent test results  Airway Mallampati: III  TM Distance: >3 FB Neck ROM: Full    Dental  (+) Dental Advisory Given, Missing   Pulmonary Current Smoker and Patient abstained from smoking.   Pulmonary exam normal breath sounds clear to auscultation       Cardiovascular hypertension, Pt. on medications Normal cardiovascular exam Rhythm:Regular Rate:Normal     Neuro/Psych negative neurological ROS  negative psych ROS   GI/Hepatic negative GI ROS,,,(+)     substance abuse  alcohol use  Endo/Other  negative endocrine ROS    Renal/GU negative Renal ROS  negative genitourinary   Musculoskeletal negative musculoskeletal ROS (+)    Abdominal   Peds negative pediatric ROS (+)  Hematology negative hematology ROS (+)   Anesthesia Other Findings   Reproductive/Obstetrics negative OB ROS                             Anesthesia Physical Anesthesia Plan  ASA: 2  Anesthesia Plan: General   Post-op Pain Management: Minimal or no pain anticipated   Induction: Intravenous  PONV Risk Score and Plan: 1 and Propofol infusion  Airway Management Planned: Nasal Cannula and Natural Airway  Additional Equipment:   Intra-op Plan:   Post-operative Plan:   Informed Consent: I have reviewed the patients History and Physical, chart, labs and discussed the procedure including the risks, benefits and alternatives for the proposed anesthesia with the patient or authorized representative who has indicated his/her understanding and acceptance.     Dental advisory given  Plan Discussed with: CRNA and Surgeon  Anesthesia Plan Comments:        Anesthesia Quick Evaluation

## 2022-06-23 NOTE — H&P (Signed)
Robert Cameron is an 62 y.o. male.   Chief Complaint: History of colonic polyps HPI: 62 year old male with past medical history of hypertension, coming for history of colon polyps.  Last colonoscopy performed in 2018, had presence of 6 tubular adenomas.  The patient denies having any complaints such as melena, hematochezia, abdominal pain or distention, change in her bowel movement consistency or frequency, no changes in weight recently.  No family history of colorectal cancer.   Past Medical History:  Diagnosis Date   Hypertension     Past Surgical History:  Procedure Laterality Date   APPENDECTOMY     COLONOSCOPY N/A 11/03/2016   Procedure: COLONOSCOPY;  Surgeon: Robert Houston, MD;  Location: [facility];  Service: Endoscopy;  Laterality: N/A;  53    Family History  Problem Relation Age of Onset   Colon cancer Brother    Cancer - Lung Brother    Dementia Mother    Cancer Father        bladder   Social History:  reports that he has been smoking cigarettes. He has a 15.00 pack-year smoking history. He has quit using smokeless tobacco. He reports current alcohol use of about 12.0 standard drinks of alcohol per week. He reports that he does not use drugs.  Allergies: No Known Allergies  Medications Prior to Admission  Medication Sig Dispense Refill   diltiazem (CARDIZEM CD) 180 MG 24 hr capsule Take 180 mg by mouth daily.     losartan (COZAAR) 100 MG tablet Take 100 mg by mouth daily.  12    No results found for this or any previous visit (from the past 48 hour(s)). No results found.  Review of Systems  All other systems reviewed and are negative.   Blood pressure 137/89, pulse 75, temperature 98.2 F (36.8 C), temperature source Oral, resp. rate 16, SpO2 97 %. Physical Exam  GENERAL: The patient is AO x3, in no acute distress. HEENT: Head is normocephalic and atraumatic. EOMI are intact. Mouth is well hydrated and without lesions. NECK: Supple. No masses LUNGS: Clear  to auscultation. No presence of rhonchi/wheezing/rales. Adequate chest expansion HEART: RRR, normal s1 and s2. ABDOMEN: Soft, nontender, no guarding, no peritoneal signs, and nondistended. BS +. No masses. EXTREMITIES: Without any cyanosis, clubbing, rash, lesions or edema. NEUROLOGIC: AOx3, no focal motor deficit. SKIN: no jaundice, no rashes  Assessment/Plan 62 year old male with past medical history of hypertension, coming for history of colon polyps.  Will proceed with colonoscopy. Robert Quale, MD 06/23/2022, 7:37 AM

## 2022-06-23 NOTE — Op Note (Signed)
[facility] Patient Name: [facility] Procedure Date: 06/23/2022 8:21 AM MRN: [id number] Date of Birth: 08-08-1960 Attending MD: Maylon Peppers , , [id number] CSN: [id number] Age: 62 Admit Type: Outpatient Procedure:                Colonoscopy Indications:              Surveillance: Personal history of adenomatous                            polyps on last colonoscopy 5 years ago Providers:                Maylon Peppers, Anibal Henderson                            Technician, Technician Referring MD:              Medicines:                Monitored Anesthesia Care Complications:            No immediate complications. Estimated Blood Loss:     Estimated blood loss: none. Procedure:                Pre-Anesthesia Assessment:                           - Prior to the procedure, a History and Physical                            was performed, and patient medications, allergies                            and sensitivities were reviewed. The patient's                            tolerance of previous anesthesia was reviewed.                           - The risks and benefits of the procedure and the                            sedation options and risks were discussed with the                            patient. All questions were answered and informed                            consent was obtained.                           - ASA Grade Assessment: II - A patient with mild                            systemic disease.                           After obtaining informed consent, the colonoscope  was passed under direct vision. Throughout the                            procedure, the patient's blood pressure, pulse, and                            oxygen saturations were monitored continuously. The                            [id number]-[id number] ([id number]) scope was introduced through                            the anus and advanced to the the cecum, identified                             by appendiceal orifice and ileocecal valve. The                            colonoscopy was performed without difficulty. The                            patient tolerated the procedure well. The quality                            of the bowel preparation was good. Scope In: 8:37:22 AM Scope Out: 8:52:50 AM Scope Withdrawal Time: 0 hours 11 minutes 23 seconds  Total Procedure Duration: 0 hours 15 minutes 28 seconds  Findings:      The perianal and digital rectal examinations were normal.      Three sessile polyps were found in the transverse colon and ascending       colon. The polyps were 2 to 8 mm in size. These polyps were removed with       a cold snare. Resection and retrieval were complete.      Scattered small-mouthed diverticula were found in the sigmoid colon.      Non-bleeding internal hemorrhoids were found during retroflexion. The       hemorrhoids were small. Impression:               - Three 2 to 8 mm polyps in the transverse colon                            and in the ascending colon, removed with a cold                            snare. Resected and retrieved.                           - Diverticulosis in the sigmoid colon.                           - Non-bleeding internal hemorrhoids. Moderate Sedation:      Per Anesthesia Care Recommendation:           - Discharge patient to home (ambulatory).                           -  Resume previous diet.                           - Await pathology results.                           - Repeat colonoscopy for surveillance based on                            pathology results. Procedure Code(s):        --- Professional ---                           [phone], Colonoscopy, flexible; with removal of                            tumor(s), polyp(s), or other lesion(s) by snare                            technique Diagnosis Code(s):        --- Professional ---                           Z86.010, Personal history of  colonic polyps                           D12.3, Benign neoplasm of transverse colon (hepatic                            flexure or splenic flexure)                           D12.2, Benign neoplasm of ascending colon                           K64.8, Other hemorrhoids                           K57.30, Diverticulosis of large intestine without                            perforation or abscess without bleeding CPT copyright 2022 American Medical Association. All rights reserved. The codes documented in this report are preliminary and upon coder review may  be revised to meet current compliance requirements. Maylon Peppers, MD Maylon Peppers,  06/23/2022 8:57:18 AM This report has been signed electronically. Number of Addenda: 0

## 2022-06-24 LAB — SURGICAL PATHOLOGY

## 2022-06-28 ENCOUNTER — Encounter (HOSPITAL_COMMUNITY): Payer: Self-pay | Admitting: Gastroenterology

## 2024-04-03 ENCOUNTER — Encounter (INDEPENDENT_AMBULATORY_CARE_PROVIDER_SITE_OTHER): Payer: Self-pay | Admitting: Gastroenterology

## 2024-05-16 ENCOUNTER — Other Ambulatory Visit (HOSPITAL_COMMUNITY): Payer: Self-pay | Admitting: Internal Medicine

## 2024-05-16 DIAGNOSIS — F17220 Nicotine dependence, chewing tobacco, uncomplicated: Secondary | ICD-10-CM

## 2024-06-07 ENCOUNTER — Other Ambulatory Visit (HOSPITAL_COMMUNITY): Payer: Self-pay | Admitting: Internal Medicine

## 2024-06-07 DIAGNOSIS — F17220 Nicotine dependence, chewing tobacco, uncomplicated: Secondary | ICD-10-CM

## 2024-06-07 DIAGNOSIS — E785 Hyperlipidemia, unspecified: Secondary | ICD-10-CM
